# Patient Record
Sex: Female | Born: 1950 | Race: White | Hispanic: No | Smoking: Never smoker
Health system: Southern US, Community
[De-identification: ages and names within clinical notes are randomized; demographics above are authoritative.]

## PROBLEM LIST (undated history)

## (undated) DIAGNOSIS — K5792 Diverticulitis of intestine, part unspecified, without perforation or abscess without bleeding: Secondary | ICD-10-CM

## (undated) HISTORY — PX: CHOLECYSTECTOMY: SHX55

---

## 2012-03-28 ENCOUNTER — Ambulatory Visit: Payer: Self-pay | Admitting: Family Medicine

## 2013-05-19 IMAGING — MG MM DIGITAL SCREENING BILAT W/ CAD
1 series · 4 of 4 positions shown · non-contrast
Comparison: none

REASON FOR EXAM: scr mammo no order
COMMENTS:

[R CC · right · 4 of 4 slices shown]
[im 1/4]
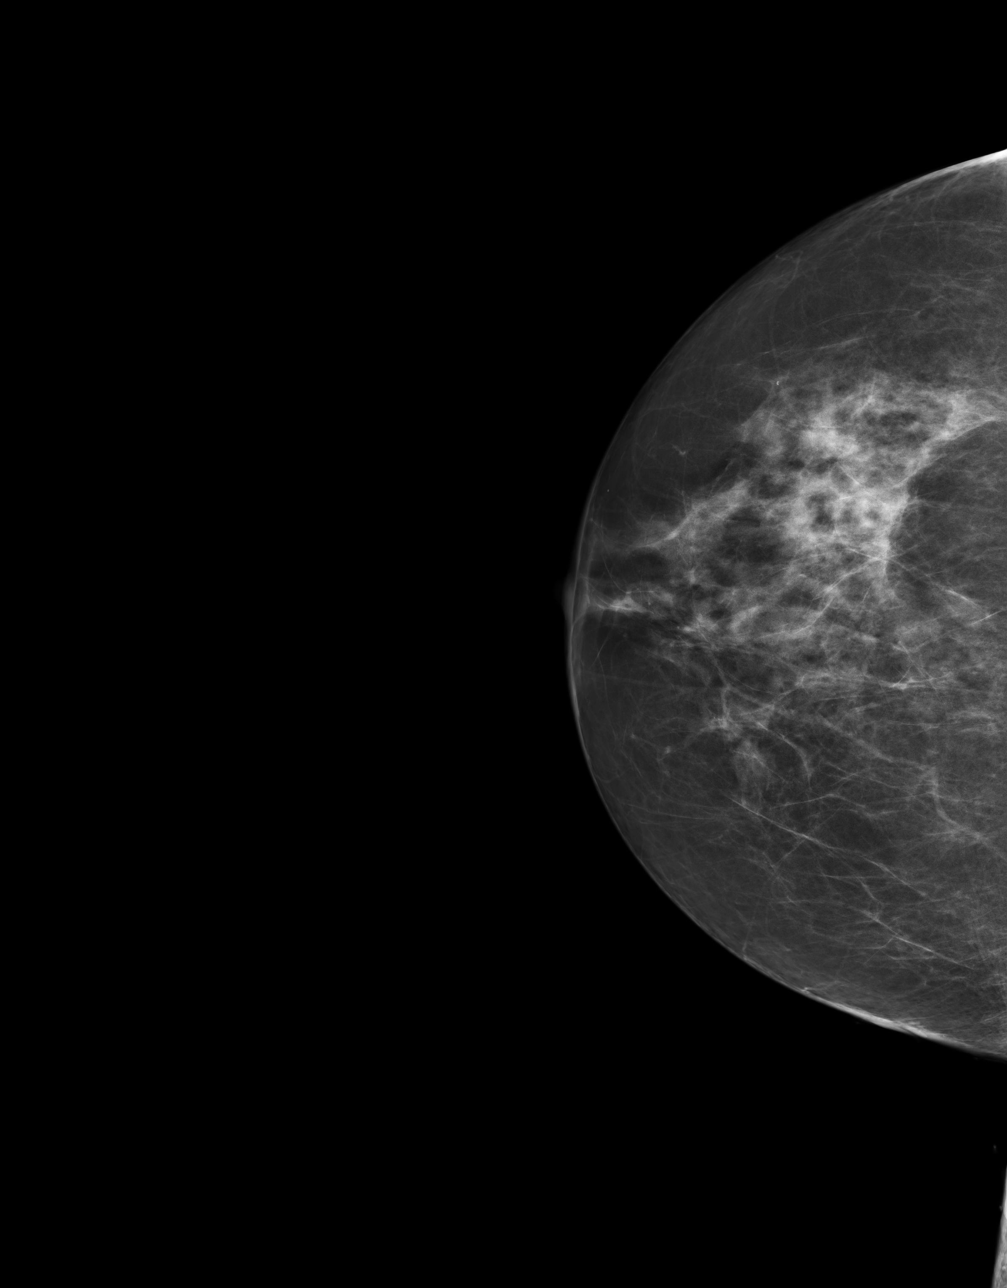
[im 2/4]
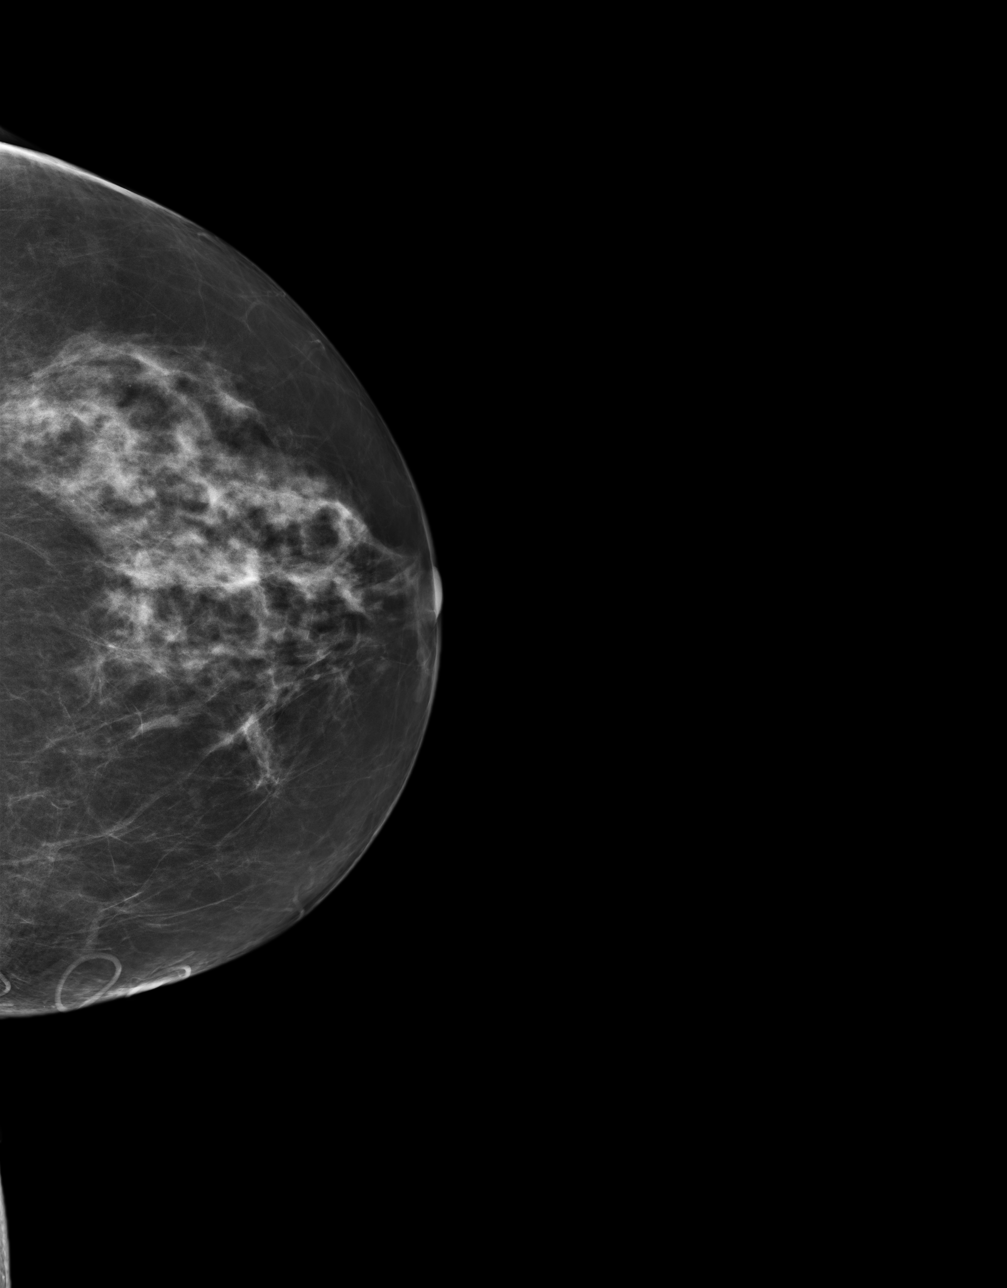
[im 3/4]
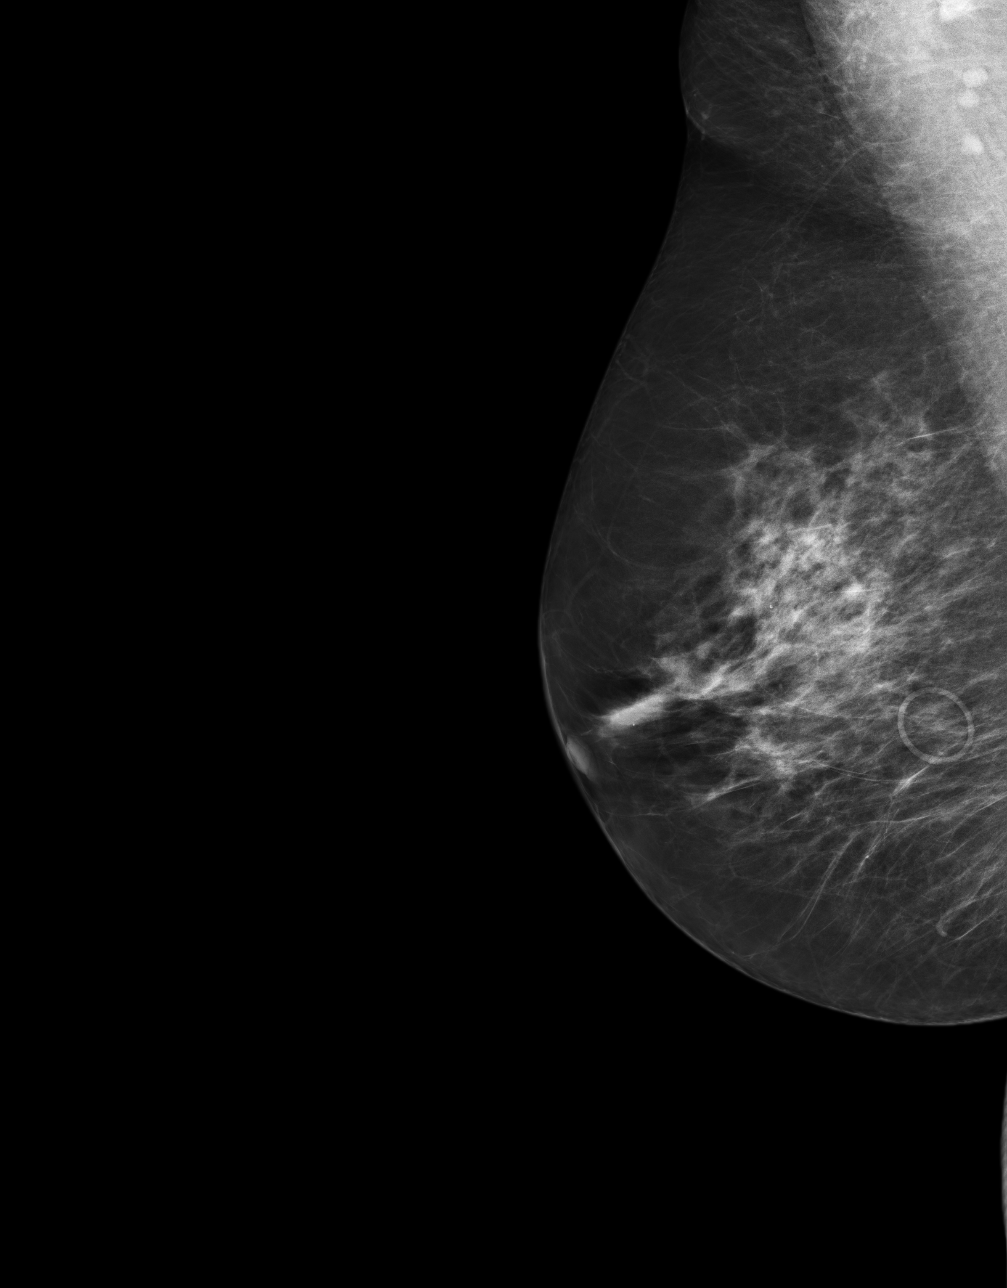
[im 4/4]
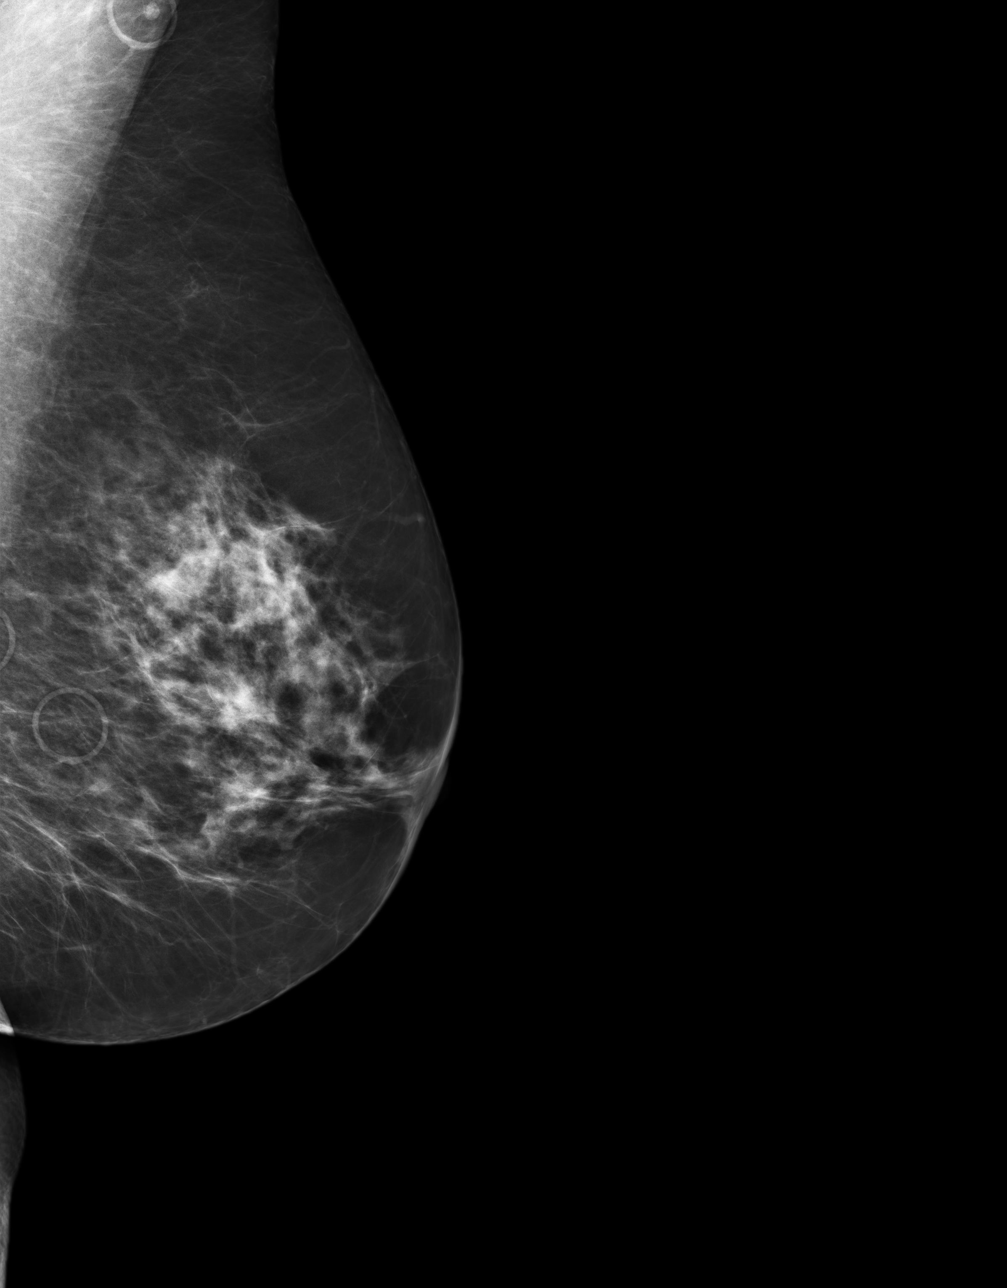

[4 of 4 positions shown; findings below may reference images not displayed]

PROCEDURE:     MMM - MMM DGT SCR NO ORDER W/CAD  - March 28, 2012  [DATE]

RESULT:     Comparison is made to previous digital studies dated 30 January, 2007 as well as 09 January, 2004 from [REDACTED].

The breasts exhibit a symmetric moderately dense parenchymal pattern with
evidence of ongoing involution. Skin nevi were marked bilaterally. There is
no dominant mass. There are no malignant appearing groupings of
microcalcification. No area of new architectural distortion is seen.
IMPRESSION: There are no findings suspicious for malignancy.

BI-RADS 2: Benign findings.

Recommendation: please continue to encourage yearly mammographic followup.

A NEGATIVE MAMMOGRAM REPORT DOES NOT PRECLUDE BIOPSY OR OTHER EVALUATION OF
A CLINICALLY PALPABLE OR OTHERWISE SUSPICIOUS MASS OR LESION. BREAST CANCER
MAY NOT BE DETECTED BY MAMMOGRAPHY IN UP TO 10% OF CASES.

[REDACTED]

## 2015-01-25 ENCOUNTER — Ambulatory Visit
Admission: EM | Admit: 2015-01-25 | Discharge: 2015-01-25 | Disposition: A | Discharge status: Home / Self Care | Payer: Federal, State, Local not specified - PPO | Attending: Family Medicine | Admitting: Family Medicine

## 2015-01-25 ENCOUNTER — Ambulatory Visit: Payer: Federal, State, Local not specified - PPO

## 2015-01-25 ENCOUNTER — Encounter: Payer: Self-pay | Admitting: Gynecology

## 2015-01-25 DIAGNOSIS — N390 Urinary tract infection, site not specified: Secondary | ICD-10-CM

## 2015-01-25 DIAGNOSIS — R109 Unspecified abdominal pain: Secondary | ICD-10-CM | POA: Diagnosis not present

## 2015-01-25 HISTORY — DX: Diverticulitis of intestine, part unspecified, without perforation or abscess without bleeding: K57.92

## 2015-01-25 LAB — URINALYSIS COMPLETE WITH MICROSCOPIC (ARMC ONLY)
Bilirubin Urine: NEGATIVE
GLUCOSE, UA: NEGATIVE mg/dL
KETONES UR: NEGATIVE mg/dL
Nitrite: NEGATIVE
PROTEIN: NEGATIVE mg/dL
SPECIFIC GRAVITY, URINE: 1.01 (ref 1.005–1.030)
pH: 6 (ref 5.0–8.0)

## 2015-01-25 MED ORDER — OXYCODONE-ACETAMINOPHEN 5-325 MG PO TABS: 1.0000 | ORAL_TABLET | Freq: Three times a day (TID) | ORAL | Status: DC | PRN

## 2015-01-25 MED ORDER — AMOXICILLIN-POT CLAVULANATE 250-62.5 MG/5ML PO SUSR: 500.0000 mg | Freq: Three times a day (TID) | ORAL | Status: AC

## 2015-01-25 MED ORDER — OXYCODONE HCL 5 MG PO TABS
5.0000 mg | ORAL_TABLET | Freq: Three times a day (TID) | ORAL | Status: AC | PRN
Start: 2015-01-25 — End: ?

## 2015-01-25 MED ORDER — KETOROLAC TROMETHAMINE 60 MG/2ML IM SOLN
60.0000 mg | Freq: Once | INTRAMUSCULAR | Status: AC
  Administered 2015-01-25: 60 mg via INTRAMUSCULAR

## 2015-01-25 NOTE — ED Provider Notes (Signed)
CSN: [id number]     Arrival date & time 01/25/15  1429 History   First MD Initiated Contact with Patient 01/25/15 1528     Chief Complaint  Patient presents with  . Abdominal Pain  . Flank Pain   (Consider location/radiation/quality/duration/timing/severity/associated sxs/prior Treatment) HPI Comments: 64 yo female with a 2 day h/o left upper abdominal pain and left flank pain; mild discomfort with urination and nausea.  Denies any fevers, chills, vomiting, constipation, melena, hematochezia. Has a h/o diverticulitis, but current symptoms not quite the same.   The history is provided by the patient.    Past Medical History  Diagnosis Date  . Diverticulitis    Past Surgical History  Procedure Laterality Date  . Cholecystectomy      partial   History reviewed. No pertinent family history. Social History  Substance Use Topics  . Smoking status: Never Smoker   . Smokeless tobacco: None  . Alcohol Use: No   OB History    No data available     Review of Systems  Allergies  Morphine and related  Home Medications   Prior to Admission medications   Medication Sig Start Date End Date Taking? Authorizing Pairlee Sawtell  aspirin 81 MG tablet Take 81 mg by mouth daily.   Yes Historical Luverta Korte, MD  levothyroxine (SYNTHROID, LEVOTHROID) 75 MCG tablet Take 75 mcg by mouth daily before breakfast.   Yes Historical Murdock Jellison, MD  metoprolol succinate (TOPROL-XL) 25 MG 24 hr tablet Take 25 mg by mouth daily.   Yes Historical Montine Hight, MD  amoxicillin-clavulanate (AUGMENTIN) 250-62.5 MG/5ML suspension Take 10 mLs (500 mg total) by mouth 3 (three) times daily. 01/25/15   Payton Mccallum, MD  oxyCODONE (ROXICODONE) 5 MG immediate release tablet Take 1 tablet (5 mg total) by mouth every 8 (eight) hours as needed for moderate pain or severe pain (do not drive or operate heavy machinery while driving as can cause drowsiness.). 01/25/15   Renford Dills, NP   Meds Ordered and Administered this Visit    Medications  ketorolac (TORADOL) injection 60 mg (60 mg Intramuscular Given 01/25/15 1627)    BP 134/74 mmHg  Pulse 57  Temp(Src) 97.8 F (36.6 C) (Oral)  Ht  (1.702 m)  Wt 168 lb 8 oz (76.431 kg)  BMI 26.38 kg/m2  SpO2 99% No data found.   Physical Exam  Constitutional: She appears well-developed and well-nourished. No distress.  HENT:  Head: Normocephalic.  Mouth/Throat: Mucous membranes are normal.  Neck: Normal range of motion. Neck supple. No JVD present. No tracheal deviation present. No thyromegaly present.  Cardiovascular: Normal rate, regular rhythm, normal heart sounds and intact distal pulses.   No murmur heard. Pulmonary/Chest: Effort normal. No stridor. No respiratory distress. She has no wheezes. She has no rales.  Rhonchi left base  Abdominal: Soft. Bowel sounds are normal. She exhibits no distension and no mass. There is tenderness (moderate tenderness to palpation over the left upper abdomen and flank). There is no rebound and no guarding.  Lymphadenopathy:    She has no cervical adenopathy.  Skin: Skin is warm and dry. No rash noted. She is not diaphoretic.  Vitals reviewed.   ED Course  Procedures (including critical care time)  Labs Review Labs Reviewed  URINALYSIS COMPLETEWITH MICROSCOPIC ([facility] ONLY) - Abnormal; Notable for the following:    Hgb urine dipstick 1+ (*)    Leukocytes, UA TRACE (*)    Squamous Epithelial / LPF 0-5 (*)    All other components within  normal limits  URINE CULTURE    Imaging Review Dg Chest 2 View  01/25/2015   CLINICAL DATA:  64 year old female with a history of cough and left lower rib pain for 3 days.  EXAM: CHEST - 2 VIEW  COMPARISON:  None.  FINDINGS: Cardiomediastinal silhouette within normal limits in size and contour. No evidence of central vascular congestion.  Atherosclerotic calcifications of the aortic arch.  Stigmata of emphysema, with increased retrosternal airspace, flattened hemidiaphragms,  increased AP diameter, and hyperinflation on the AP view.  No pneumothorax pleural effusion or confluent airspace disease.  No displaced fracture.  Unremarkable appearance of the upper abdomen.  IMPRESSION: Emphysema with no evidence of acute cardiopulmonary disease.  Atherosclerosis.  Signed,  Yvone Neu. Loreta Ave, DO  [facility]  [facility]   Electronically Signed   By: Gilmer Mor D.O.   On: 01/25/2015 16:09     Visual Acuity Review  Right Eye Distance:   Left Eye Distance:   Bilateral Distance:    Right Eye Near:   Left Eye Near:    Bilateral Near:         MDM   1. UTI (lower urinary tract infection)   2. Flank pain    . Discharge Medication List as of 01/25/2015  4:54 PM    START taking these medications   Details  amoxicillin-clavulanate (AUGMENTIN) 250-62.5 MG/5ML suspension Take 10 mLs (500 mg total) by mouth 3 (three) times daily., Starting 01/25/2015, Until Discontinued, Print    oxyCODONE (ROXICODONE) 5 MG immediate release tablet Take 1 tablet (5 mg total) by mouth every 8 (eight) hours as needed for moderate pain or severe pain (do not drive or operate heavy machinery while driving as can cause drowsiness.)., Starting 01/25/2015, Until Discontinued, Print      Plan: 1. Test results and diagnosis reviewed with patient  2. rx as per orders; risks, benefits, potential side effects reviewed with patient 3. Recommend supportive treatment with increased fluids; recommend go to ED if symptoms worsen for further evaluation (possible further imaging) 4. F/u prn  Payton Mccallum, MD 01/25/15 1733

## 2015-01-25 NOTE — ED Notes (Signed)
Pt. Complaining of flank and abdominal pain.  Seems to be worse with sitting.

## 2015-01-27 LAB — URINE CULTURE

## 2016-03-17 IMAGING — CR DG CHEST 2V
2 series · 2 of 2 positions shown · non-contrast
Comparison: None.

CLINICAL DATA: 63-year-old female with a history of cough and left
lower rib pain for 3 days.

EXAM:
CHEST - 2 VIEW

[chest pa]
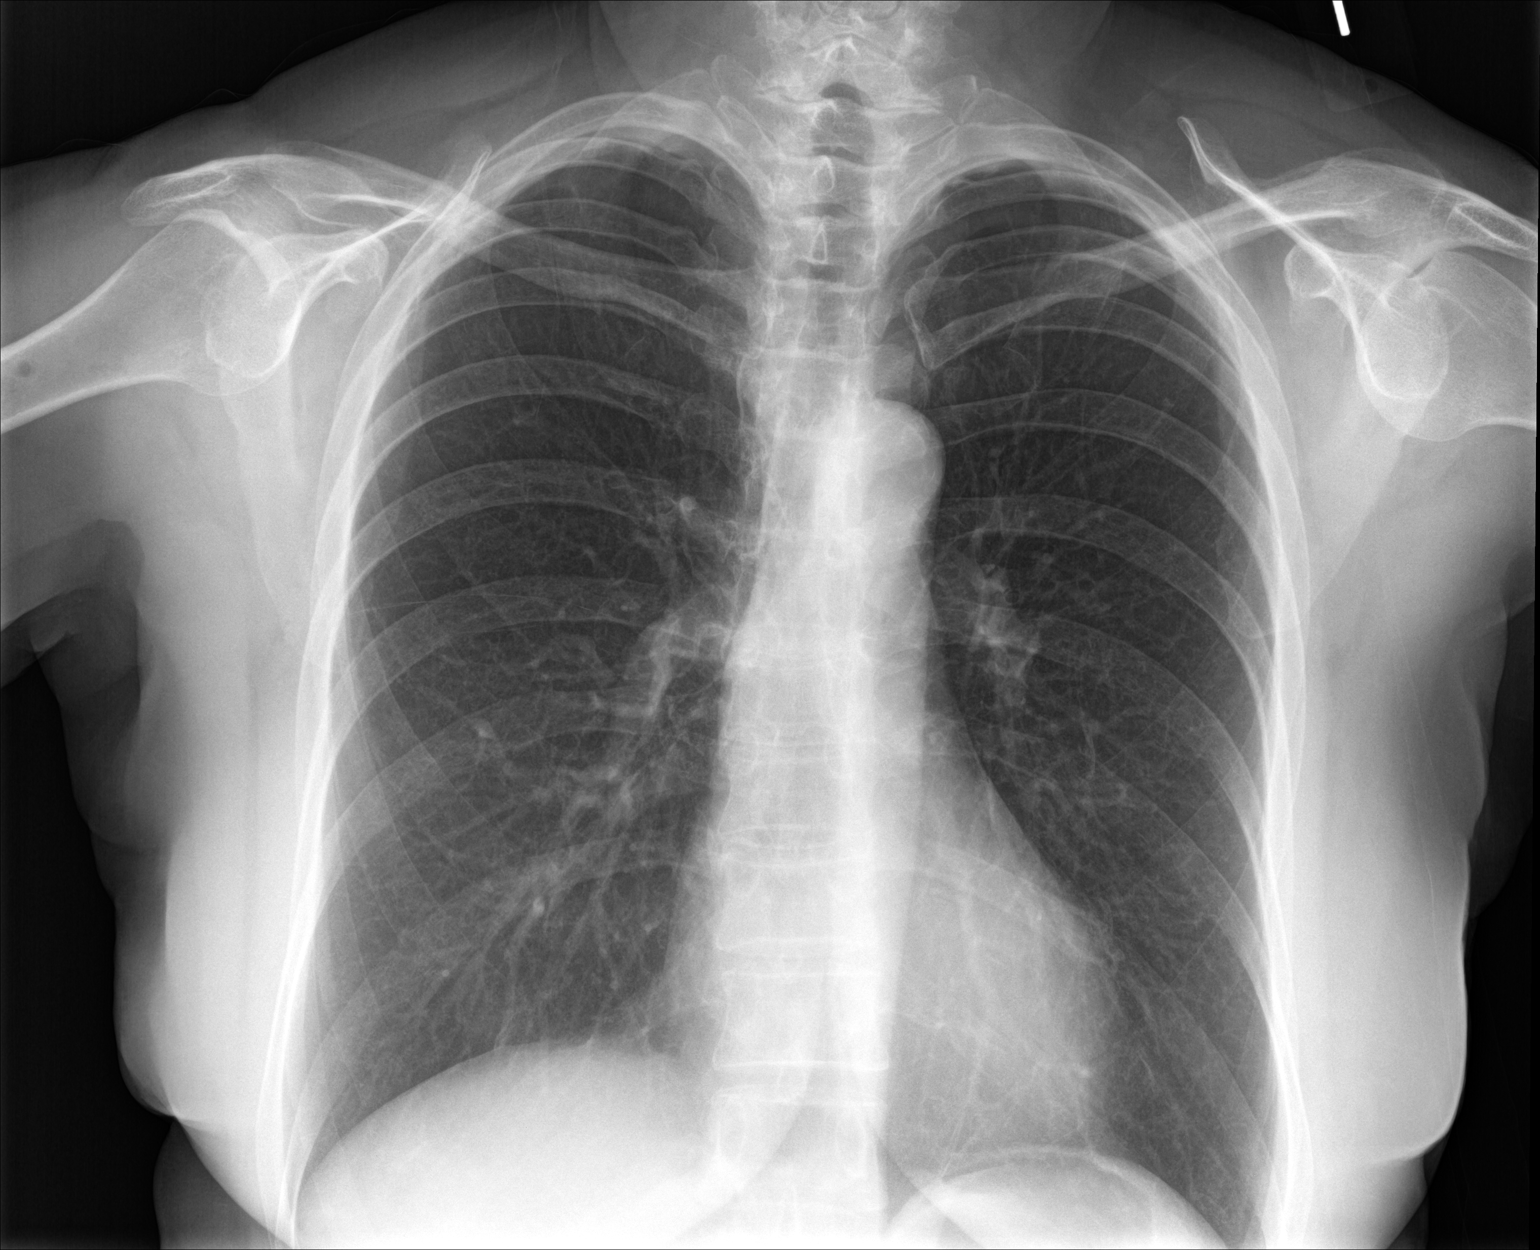

[chest lat]
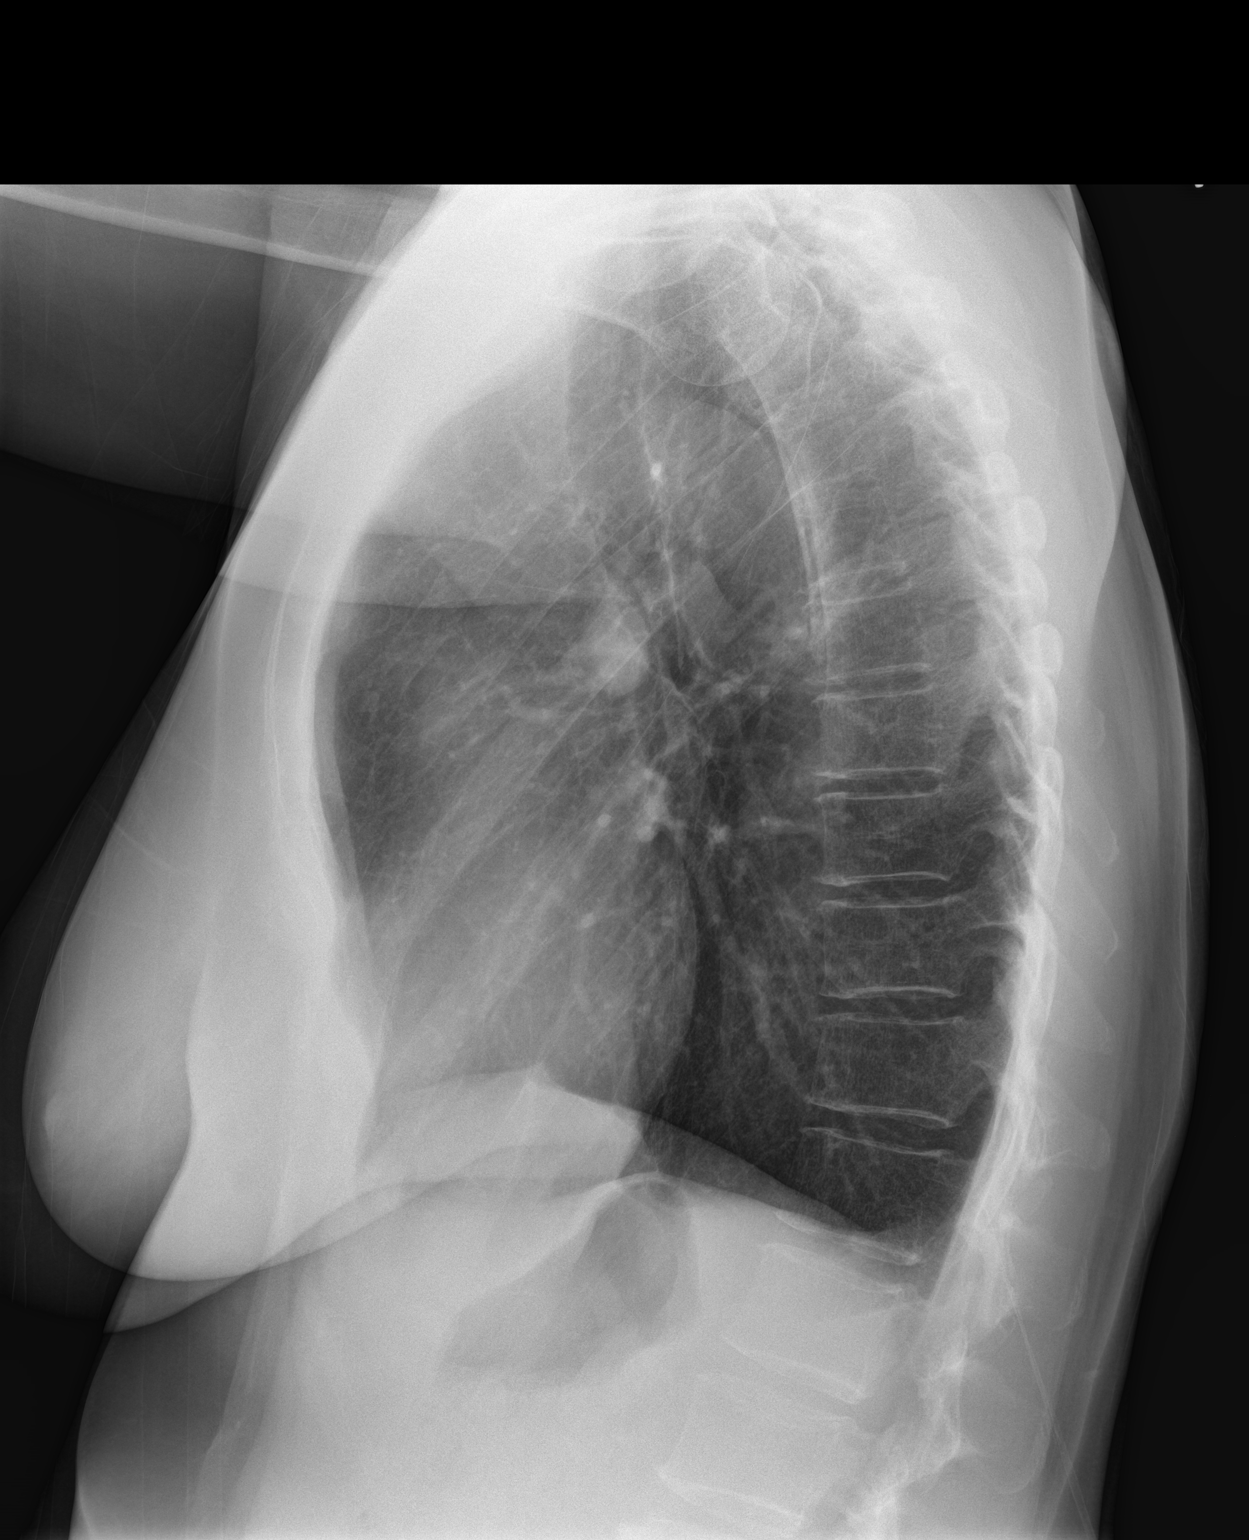

[2 of 2 positions shown; findings below may reference images not displayed]

FINDINGS: Cardiomediastinal silhouette within normal limits in size and
contour. No evidence of central vascular congestion.

Atherosclerotic calcifications of the aortic arch.

Stigmata of emphysema, with increased retrosternal airspace,
flattened hemidiaphragms, increased AP diameter, and hyperinflation
on the AP view.

No pneumothorax pleural effusion or confluent airspace disease.

No displaced fracture.

Unremarkable appearance of the upper abdomen.
IMPRESSION: Emphysema with no evidence of acute cardiopulmonary disease.

Atherosclerosis.

## 2024-02-16 ENCOUNTER — Ambulatory Visit
